# Patient Record
Sex: Female | Born: 1960 | Race: White | Hispanic: No | Marital: Married
Health system: Southern US, Community
[De-identification: ages and names within clinical notes are randomized; demographics above are authoritative.]

---

## 2019-02-01 ENCOUNTER — Emergency Department (HOSPITAL_COMMUNITY): Payer: PRIVATE HEALTH INSURANCE

## 2019-02-01 ENCOUNTER — Other Ambulatory Visit: Payer: Self-pay

## 2019-02-01 ENCOUNTER — Emergency Department (HOSPITAL_COMMUNITY)
Admission: EM | Admit: 2019-02-01 | Discharge: 2019-02-01 | Disposition: A | Discharge status: Home / Self Care | Payer: PRIVATE HEALTH INSURANCE | Attending: Emergency Medicine | Admitting: Emergency Medicine

## 2019-02-01 DIAGNOSIS — Y999 Unspecified external cause status: Secondary | ICD-10-CM | POA: Diagnosis not present

## 2019-02-01 DIAGNOSIS — S61201A Unspecified open wound of left index finger without damage to nail, initial encounter: Secondary | ICD-10-CM | POA: Diagnosis not present

## 2019-02-01 DIAGNOSIS — Y92017 Garden or yard in single-family (private) house as the place of occurrence of the external cause: Secondary | ICD-10-CM | POA: Diagnosis not present

## 2019-02-01 DIAGNOSIS — W272XXA Contact with scissors, initial encounter: Secondary | ICD-10-CM | POA: Diagnosis not present

## 2019-02-01 DIAGNOSIS — S6992XA Unspecified injury of left wrist, hand and finger(s), initial encounter: Secondary | ICD-10-CM | POA: Diagnosis present

## 2019-02-01 DIAGNOSIS — S61209A Unspecified open wound of unspecified finger without damage to nail, initial encounter: Secondary | ICD-10-CM

## 2019-02-01 DIAGNOSIS — Y93H2 Activity, gardening and landscaping: Secondary | ICD-10-CM | POA: Insufficient documentation

## 2019-02-01 MED ORDER — CEPHALEXIN 500 MG PO CAPS: 500.0000 mg | ORAL_CAPSULE | Freq: Four times a day (QID) | ORAL | 0 refills | Status: AC

## 2019-02-01 MED ORDER — "THROMBI-PAD 3""X3"" EX PADS"
1.0000 | MEDICATED_PAD | Freq: Once | CUTANEOUS | Status: AC
  Administered 2019-02-01: 1 via TOPICAL
  Filled 2019-02-01: qty 1

## 2019-02-01 MED ORDER — CEPHALEXIN 250 MG PO CAPS
500.0000 mg | ORAL_CAPSULE | Freq: Once | ORAL | Status: AC
  Administered 2019-02-01: 500 mg via ORAL
  Filled 2019-02-01: qty 2

## 2019-02-01 NOTE — ED Notes (Signed)
Quick clot applied for active bleeding; pressure wrap applied.

## 2019-02-01 NOTE — Discharge Instructions (Addendum)
Take Keflex as prescribed until finished.  We recommend that you leave on your bandage for 24 hours.  After this time, change your bandage once per day and apply topical bacitracin to prevent infection.  Do not apply topical alcohol or peroxide as this may breakdown any new newly forming skin.  Keep the area clean with warm water and mild soap.  You may take naproxen, ibuprofen, or Tylenol for management of pain.  Should you develop signs of infection such as increased redness, swelling, fever, pus draining from the wound, have your injury reevaluated.  You may also return for any new or concerning symptoms.

## 2019-02-01 NOTE — ED Provider Notes (Signed)
[facility] EMERGENCY DEPARTMENT Provider Note   CSN: [id number] Arrival date & time: 02/01/19  1957    History   Chief Complaint Chief Complaint  Patient presents with  . Finger Injury    2nd digit, left hand    HPI Meredith Lawson is a 58 y.o. female.     58 year old female presents to the emergency department for evaluation of injury to her left index finger.  She was trimming bushes when she accidentally cut the tip of her finger with plant shears.  Injury has remained constant and unchanged. Pressure applied prior to arrival.  Patient with no significant pain at laceration site.  No medications taken prior to arrival.  States that her last tetanus was updated 2 years ago.  She is visiting from [place]. Quick clot applied in triage for hemostasis.  The history is provided by the patient. No language interpreter was used.    No past medical history on file.  There are no active problems to display for this patient.   ** The histories are not reviewed yet. Please review them in the "History" navigator section and refresh this SmartLink.   OB History   No obstetric history on file.      Home Medications    Prior to Admission medications   Medication Sig Start Date End Date Taking? Authorizing Provider  cephALEXin (KEFLEX) 500 MG capsule Take 1 capsule (500 mg total) by mouth 4 (four) times daily for 5 days. 02/01/19 02/06/19  Antony Madura, PA-C    Family History No family history on file.  Social History Social History   Tobacco Use  . Smoking status: Not on file  Substance Use Topics  . Alcohol use: Not on file  . Drug use: Not on file     Allergies   Patient has no known allergies.   Review of Systems Review of Systems Ten systems reviewed and are negative for acute change, except as noted in the HPI.    Physical Exam Updated Vital Signs BP (!) 155/97 (BP Location: Right Arm)   Pulse (!) 58   Temp 97.8 F (36.6 C)  (Oral)   Resp 16   Ht 5' 8.5" (1.74 m)   Wt 103.9 kg   SpO2 99%   BMI 34.31 kg/m   Physical Exam Vitals signs and nursing note reviewed.  Constitutional:      General: She is not in acute distress.    Appearance: She is well-developed. She is not diaphoretic.     Comments: Nontoxic appearing and in NAD  HENT:     Head: Normocephalic and atraumatic.  Eyes:     General: No scleral icterus.    Conjunctiva/sclera: Conjunctivae normal.  Neck:     Musculoskeletal: Normal range of motion.  Cardiovascular:     Rate and Rhythm: Normal rate and regular rhythm.     Pulses: Normal pulses.     Comments: Distal radial pulse 2+ in the LUE Pulmonary:     Effort: Pulmonary effort is normal. No respiratory distress.     Comments: Respirations even and unlabored Musculoskeletal: Normal range of motion.     Left hand: She exhibits tenderness (at avulsion site). She exhibits normal range of motion.       Hands:     Comments: Avulsion of the distal tip of the L index finger. Distal nail is avulsed as well. Nail bed intact. Bleeding slow, controlled with quick clot and dressing PTA.  Skin:    General:  Skin is warm and dry.     Coloration: Skin is not pale.     Findings: No erythema or rash.  Neurological:     Mental Status: She is alert and oriented to person, place, and time.  Psychiatric:        Behavior: Behavior normal.       ED Treatments / Results  Labs (all labs ordered are listed, but only abnormal results are displayed) Labs Reviewed - No data to display  EKG None  Radiology Dg Finger Index Left  Result Date: 02/01/2019 CLINICAL DATA:  Injury EXAM: LEFT INDEX FINGER 2+V COMPARISON:  None. FINDINGS: Portions of the middle and distal phalanx are obscured by overlying bandage artifact. No gross fracture or malalignment. IMPRESSION: No definite acute osseous abnormality allowing for obscuring artifact Electronically Signed   By: Jasmine Pang M.D.   On: 02/01/2019 21:03     Procedures Procedures (including critical care time)  Medications Ordered in ED Medications  THROMBI-PAD 3"X3" pad 1 each (1 each Topical Provided for home use 02/01/19 2317)  cephALEXin (KEFLEX) capsule 500 mg (500 mg Oral Given 02/01/19 2316)    11:13 PM Patient with avulsion to distal tip of digit. No palpable, pulsatile bleeding. no exposed bone noted. Neurovascularly intact to intact part of digit. Xray reviewed. No evidence of bony avulsion or fracture by my interpretation. Will provide Thrombi-pad for home use should brisk bleeding recur.   Initial Impression / Assessment and Plan / ED Course  I have reviewed the triage vital signs and the nursing notes.  Pertinent labs & imaging results that were available during my care of the patient were reviewed by me and considered in my medical decision making (see chart for details).        Patient presenting for avulsion to distal left index finger.  Avulsion included nail without injury to the nailbed or matrix.  No palpable, pulsatile bleeding.  Otherwise neurovascularly intact.  Bleeding controlled with Quick Clot applied prior to my assessment of the patient.  Dressing changed with no significant change in hemostasis of wound.  Xray reviewed without evidence of bony abnormality.  Tetanus up-to-date.  Will place on Keflex for infection prevention.  Discussed ongoing wound care as well as potential need for hand specialist follow-up.  She was offered a referral, but resides in [place].  The patient was encouraged to follow-up with her primary care doctor for referral if needed.  Return precautions discussed and provided.  Patient discharged in stable condition with no unaddressed concerns.   Final Clinical Impressions(s) / ED Diagnoses   Final diagnoses:  Avulsion of finger, initial encounter    ED Discharge Orders         Ordered    cephALEXin (KEFLEX) 500 MG capsule  4 times daily     02/01/19 2323           Antony Madura, PA-C 02/02/19 0129    Virgina Norfolk, DO 02/02/19 1453

## 2019-02-01 NOTE — ED Notes (Signed)
ED Provider at bedside. 

## 2019-02-01 NOTE — ED Triage Notes (Signed)
Patient was trimming bushes and cut the tip of her finger off with plant shears.

## 2019-09-04 IMAGING — DX DG FINGER INDEX 2+V*L*
3 series · 3 of 3 positions shown · non-contrast
Comparison: None.

CLINICAL DATA: Injury

EXAM:
LEFT INDEX FINGER 2+V

[finger ap]
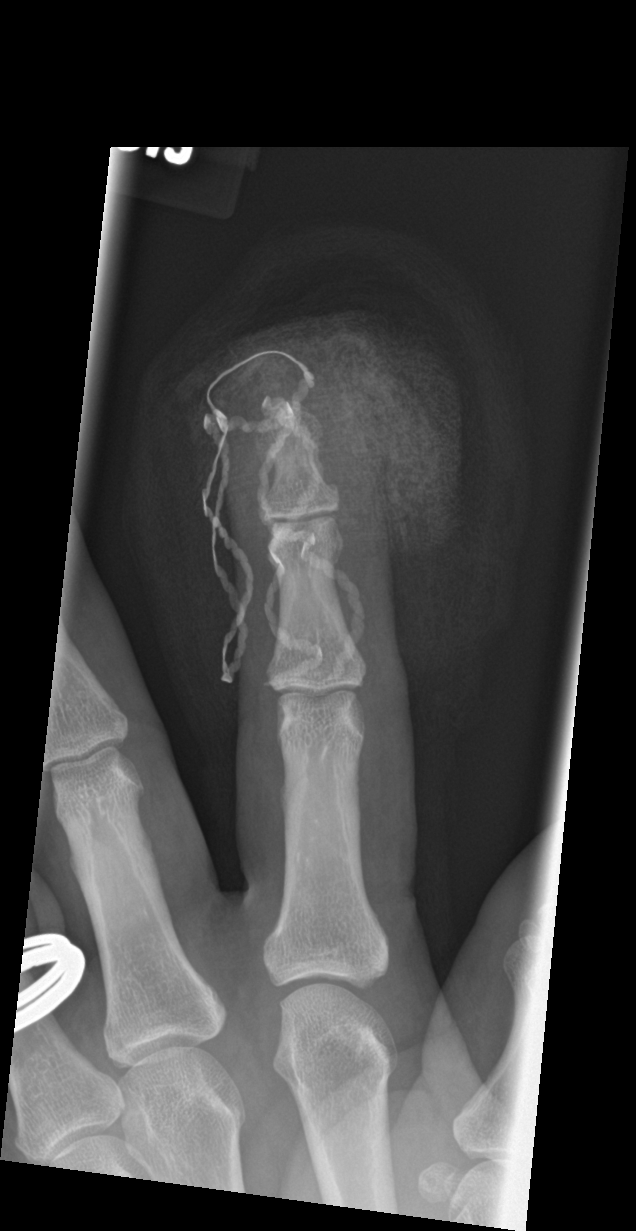

[finger obl]
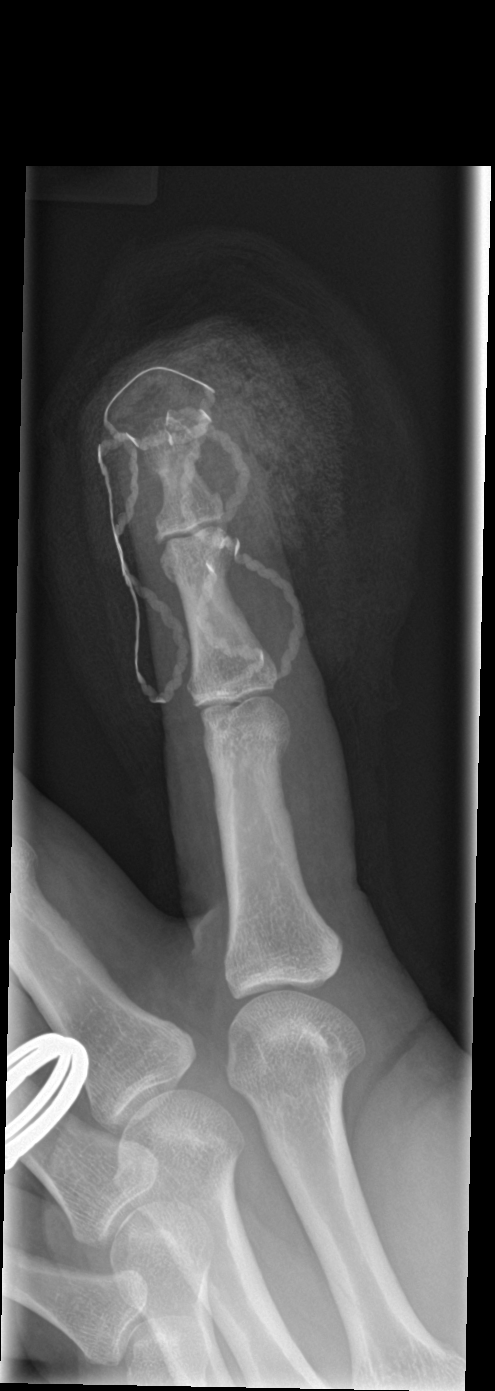

[finger lat]
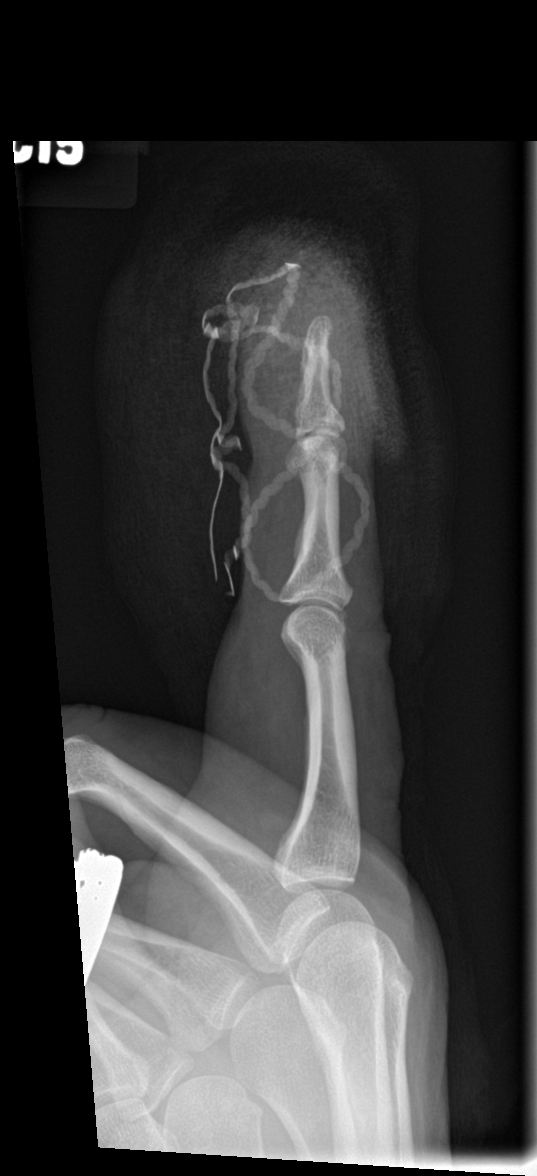

[3 of 3 positions shown; findings below may reference images not displayed]

FINDINGS: Portions of the middle and distal phalanx are obscured by overlying
bandage artifact. No gross fracture or malalignment.
IMPRESSION: No definite acute osseous abnormality allowing for obscuring
artifact
